# Patient Record
Sex: Female | Born: 1940 | Race: White | Hispanic: No | Marital: Married | Smoking: Former smoker
Health system: Southern US, Community
[De-identification: ages and names within clinical notes are randomized; demographics above are authoritative.]

## PROBLEM LIST (undated history)

## (undated) DIAGNOSIS — E079 Disorder of thyroid, unspecified: Secondary | ICD-10-CM

## (undated) DIAGNOSIS — I10 Essential (primary) hypertension: Secondary | ICD-10-CM

## (undated) HISTORY — PX: GALLBLADDER SURGERY: SHX652

## (undated) HISTORY — PX: CYST EXCISION: SHX5701

## (undated) HISTORY — PX: CHOLECYSTECTOMY: SHX55

## (undated) HISTORY — PX: ABDOMINAL HYSTERECTOMY: SHX81

---

## 2019-02-26 ENCOUNTER — Ambulatory Visit: Payer: Self-pay | Admitting: General Surgery

## 2019-02-26 DIAGNOSIS — R928 Other abnormal and inconclusive findings on diagnostic imaging of breast: Secondary | ICD-10-CM

## 2019-03-15 ENCOUNTER — Other Ambulatory Visit: Payer: Self-pay | Admitting: General Surgery

## 2019-03-15 DIAGNOSIS — R928 Other abnormal and inconclusive findings on diagnostic imaging of breast: Secondary | ICD-10-CM

## 2019-04-04 ENCOUNTER — Encounter (HOSPITAL_BASED_OUTPATIENT_CLINIC_OR_DEPARTMENT_OTHER): Payer: Self-pay | Admitting: General Surgery

## 2019-04-04 ENCOUNTER — Other Ambulatory Visit: Payer: Self-pay

## 2019-04-06 HISTORY — PX: BREAST EXCISIONAL BIOPSY: SUR124

## 2019-04-09 ENCOUNTER — Other Ambulatory Visit (HOSPITAL_COMMUNITY)
Admission: RE | Admit: 2019-04-09 | Discharge: 2019-04-09 | Disposition: A | Discharge status: Home / Self Care | Payer: Medicare Other | Source: Ambulatory Visit | Attending: General Surgery | Admitting: General Surgery

## 2019-04-09 DIAGNOSIS — Z01812 Encounter for preprocedural laboratory examination: Secondary | ICD-10-CM | POA: Insufficient documentation

## 2019-04-09 DIAGNOSIS — Z20822 Contact with and (suspected) exposure to covid-19: Secondary | ICD-10-CM | POA: Diagnosis not present

## 2019-04-09 LAB — SARS CORONAVIRUS 2 (TAT 6-24 HRS): SARS Coronavirus 2: NEGATIVE

## 2019-04-10 ENCOUNTER — Other Ambulatory Visit: Payer: Self-pay

## 2019-04-10 ENCOUNTER — Encounter (HOSPITAL_BASED_OUTPATIENT_CLINIC_OR_DEPARTMENT_OTHER)
Admission: RE | Admit: 2019-04-10 | Discharge: 2019-04-10 | Disposition: A | Discharge status: Home / Self Care | Payer: Medicare Other | Source: Ambulatory Visit | Attending: General Surgery | Admitting: General Surgery

## 2019-04-10 DIAGNOSIS — Z79890 Hormone replacement therapy (postmenopausal): Secondary | ICD-10-CM | POA: Diagnosis not present

## 2019-04-10 DIAGNOSIS — R921 Mammographic calcification found on diagnostic imaging of breast: Secondary | ICD-10-CM | POA: Diagnosis present

## 2019-04-10 DIAGNOSIS — Z888 Allergy status to other drugs, medicaments and biological substances status: Secondary | ICD-10-CM | POA: Diagnosis not present

## 2019-04-10 DIAGNOSIS — E039 Hypothyroidism, unspecified: Secondary | ICD-10-CM | POA: Diagnosis not present

## 2019-04-10 DIAGNOSIS — N6012 Diffuse cystic mastopathy of left breast: Secondary | ICD-10-CM | POA: Diagnosis not present

## 2019-04-10 DIAGNOSIS — I10 Essential (primary) hypertension: Secondary | ICD-10-CM | POA: Diagnosis not present

## 2019-04-10 DIAGNOSIS — Z886 Allergy status to analgesic agent status: Secondary | ICD-10-CM | POA: Diagnosis not present

## 2019-04-10 DIAGNOSIS — N6082 Other benign mammary dysplasias of left breast: Secondary | ICD-10-CM | POA: Diagnosis not present

## 2019-04-10 DIAGNOSIS — Z79899 Other long term (current) drug therapy: Secondary | ICD-10-CM | POA: Diagnosis not present

## 2019-04-10 DIAGNOSIS — Z885 Allergy status to narcotic agent status: Secondary | ICD-10-CM | POA: Diagnosis not present

## 2019-04-10 LAB — BASIC METABOLIC PANEL
Anion gap: 11 (ref 5–15)
BUN: 12 mg/dL (ref 8–23)
CO2: 21 mmol/L — ABNORMAL LOW (ref 22–32)
Calcium: 9.2 mg/dL (ref 8.9–10.3)
Chloride: 107 mmol/L (ref 98–111)
Creatinine, Ser: 0.68 mg/dL (ref 0.44–1.00)
GFR calc Af Amer: >60 mL/min (ref >60)
GFR calc non Af Amer: >60 mL/min (ref >60)
Glucose, Bld: 80 mg/dL (ref 70–99)
Potassium: 4.9 mmol/L (ref 3.5–5.1)
Sodium: 139 mmol/L (ref 135–145)

## 2019-04-10 NOTE — Progress Notes (Signed)

## 2019-04-11 ENCOUNTER — Ambulatory Visit
Admission: RE | Admit: 2019-04-11 | Discharge: 2019-04-11 | Disposition: A | Discharge status: Home / Self Care | Payer: Medicare Other | Source: Ambulatory Visit | Attending: General Surgery | Admitting: General Surgery

## 2019-04-11 ENCOUNTER — Other Ambulatory Visit: Payer: Self-pay

## 2019-04-12 ENCOUNTER — Ambulatory Visit
Admission: RE | Admit: 2019-04-12 | Discharge: 2019-04-12 | Disposition: A | Discharge status: Home / Self Care | Payer: Medicare Other | Source: Ambulatory Visit | Attending: General Surgery | Admitting: General Surgery

## 2019-04-12 ENCOUNTER — Encounter (HOSPITAL_BASED_OUTPATIENT_CLINIC_OR_DEPARTMENT_OTHER): Payer: Self-pay | Admitting: General Surgery

## 2019-04-12 ENCOUNTER — Ambulatory Visit (HOSPITAL_BASED_OUTPATIENT_CLINIC_OR_DEPARTMENT_OTHER): Payer: Medicare Other | Admitting: Anesthesiology

## 2019-04-12 ENCOUNTER — Ambulatory Visit (HOSPITAL_BASED_OUTPATIENT_CLINIC_OR_DEPARTMENT_OTHER)
Admission: RE | Admit: 2019-04-12 | Discharge: 2019-04-12 | Disposition: A | Discharge status: Home / Self Care | Payer: Medicare Other | Attending: General Surgery | Admitting: General Surgery

## 2019-04-12 ENCOUNTER — Other Ambulatory Visit: Payer: Self-pay

## 2019-04-12 ENCOUNTER — Encounter (HOSPITAL_BASED_OUTPATIENT_CLINIC_OR_DEPARTMENT_OTHER)
Admission: RE | Disposition: A | Discharge status: Home / Self Care | Payer: Self-pay | Source: Home / Self Care | Attending: General Surgery

## 2019-04-12 DIAGNOSIS — N6012 Diffuse cystic mastopathy of left breast: Secondary | ICD-10-CM | POA: Diagnosis not present

## 2019-04-12 DIAGNOSIS — Z79899 Other long term (current) drug therapy: Secondary | ICD-10-CM | POA: Insufficient documentation

## 2019-04-12 DIAGNOSIS — Z886 Allergy status to analgesic agent status: Secondary | ICD-10-CM | POA: Insufficient documentation

## 2019-04-12 DIAGNOSIS — I10 Essential (primary) hypertension: Secondary | ICD-10-CM | POA: Insufficient documentation

## 2019-04-12 DIAGNOSIS — Z885 Allergy status to narcotic agent status: Secondary | ICD-10-CM | POA: Insufficient documentation

## 2019-04-12 DIAGNOSIS — N6082 Other benign mammary dysplasias of left breast: Secondary | ICD-10-CM | POA: Diagnosis not present

## 2019-04-12 DIAGNOSIS — Z888 Allergy status to other drugs, medicaments and biological substances status: Secondary | ICD-10-CM | POA: Insufficient documentation

## 2019-04-12 DIAGNOSIS — Z79890 Hormone replacement therapy (postmenopausal): Secondary | ICD-10-CM | POA: Insufficient documentation

## 2019-04-12 DIAGNOSIS — R928 Other abnormal and inconclusive findings on diagnostic imaging of breast: Secondary | ICD-10-CM

## 2019-04-12 DIAGNOSIS — E039 Hypothyroidism, unspecified: Secondary | ICD-10-CM | POA: Diagnosis not present

## 2019-04-12 HISTORY — PX: RADIOACTIVE SEED GUIDED EXCISIONAL BREAST BIOPSY: SHX6490

## 2019-04-12 HISTORY — DX: Disorder of thyroid, unspecified: E07.9

## 2019-04-12 HISTORY — DX: Essential (primary) hypertension: I10

## 2019-04-12 SURGERY — RADIOACTIVE SEED GUIDED BREAST BIOPSY
Anesthesia: General | Site: Breast | Laterality: Left

## 2019-04-12 MED ORDER — CHLORHEXIDINE GLUCONATE CLOTH 2 % EX PADS: 6.0000 | MEDICATED_PAD | Freq: Once | CUTANEOUS | Status: DC

## 2019-04-12 MED ORDER — PROPOFOL 10 MG/ML IV BOLUS
INTRAVENOUS | Status: AC
  Filled 2019-04-12: qty 20

## 2019-04-12 MED ORDER — FENTANYL CITRATE (PF) 100 MCG/2ML IJ SOLN
50.0000 ug | INTRAMUSCULAR | Status: AC | PRN
  Administered 2019-04-12: 25 ug via INTRAVENOUS
  Administered 2019-04-12: 50 ug via INTRAVENOUS
  Administered 2019-04-12: 25 ug via INTRAVENOUS

## 2019-04-12 MED ORDER — MIDAZOLAM HCL 2 MG/2ML IJ SOLN: 1.0000 mg | INTRAMUSCULAR | Status: DC | PRN

## 2019-04-12 MED ORDER — SCOPOLAMINE 1 MG/3DAYS TD PT72: 1.0000 | MEDICATED_PATCH | TRANSDERMAL | Status: DC

## 2019-04-12 MED ORDER — PROMETHAZINE HCL 25 MG/ML IJ SOLN: 6.2500 mg | INTRAMUSCULAR | Status: DC | PRN

## 2019-04-12 MED ORDER — ONDANSETRON HCL 4 MG/2ML IJ SOLN
INTRAMUSCULAR | Status: DC | PRN
  Administered 2019-04-12: 4 mg via INTRAVENOUS

## 2019-04-12 MED ORDER — ACETAMINOPHEN 500 MG PO TABS
1000.0000 mg | ORAL_TABLET | ORAL | Status: AC
  Administered 2019-04-12: 1000 mg via ORAL

## 2019-04-12 MED ORDER — ONDANSETRON HCL 4 MG/2ML IJ SOLN
INTRAMUSCULAR | Status: AC
  Filled 2019-04-12: qty 2

## 2019-04-12 MED ORDER — ACETAMINOPHEN 500 MG PO TABS
ORAL_TABLET | ORAL | Status: AC
  Filled 2019-04-12: qty 2

## 2019-04-12 MED ORDER — FENTANYL CITRATE (PF) 100 MCG/2ML IJ SOLN
INTRAMUSCULAR | Status: AC
  Filled 2019-04-12: qty 2

## 2019-04-12 MED ORDER — LIDOCAINE HCL (CARDIAC) PF 100 MG/5ML IV SOSY
PREFILLED_SYRINGE | INTRAVENOUS | Status: DC | PRN
  Administered 2019-04-12: 50 mg via INTRAVENOUS

## 2019-04-12 MED ORDER — LACTATED RINGERS IV SOLN: INTRAVENOUS | Status: DC

## 2019-04-12 MED ORDER — BUPIVACAINE HCL (PF) 0.25 % IJ SOLN
INTRAMUSCULAR | Status: DC | PRN
  Administered 2019-04-12: 15 mL

## 2019-04-12 MED ORDER — LIDOCAINE-EPINEPHRINE 0.5 %-1:200000 IJ SOLN
INTRAMUSCULAR | Status: DC | PRN
  Administered 2019-04-12: 15 mL

## 2019-04-12 MED ORDER — LIDOCAINE 2% (20 MG/ML) 5 ML SYRINGE
INTRAMUSCULAR | Status: AC
  Filled 2019-04-12: qty 5

## 2019-04-12 MED ORDER — EPHEDRINE 5 MG/ML INJ
INTRAVENOUS | Status: AC
  Filled 2019-04-12: qty 10

## 2019-04-12 MED ORDER — TRAMADOL HCL 50 MG PO TABS: 50.0000 mg | ORAL_TABLET | Freq: Two times a day (BID) | ORAL | 0 refills | Status: AC | PRN

## 2019-04-12 MED ORDER — KETOROLAC TROMETHAMINE 15 MG/ML IJ SOLN: 15.0000 mg | Freq: Once | INTRAMUSCULAR | Status: DC | PRN

## 2019-04-12 MED ORDER — PROPOFOL 10 MG/ML IV BOLUS
INTRAVENOUS | Status: DC | PRN
  Administered 2019-04-12: 150 mg via INTRAVENOUS

## 2019-04-12 MED ORDER — EPHEDRINE SULFATE 50 MG/ML IJ SOLN
INTRAMUSCULAR | Status: DC | PRN
  Administered 2019-04-12 (×2): 10 mg via INTRAVENOUS

## 2019-04-12 MED ORDER — FENTANYL CITRATE (PF) 100 MCG/2ML IJ SOLN: 25.0000 ug | INTRAMUSCULAR | Status: DC | PRN

## 2019-04-12 MED ORDER — CEFAZOLIN SODIUM-DEXTROSE 2-4 GM/100ML-% IV SOLN
2.0000 g | INTRAVENOUS | Status: AC
  Administered 2019-04-12: 2 g via INTRAVENOUS

## 2019-04-12 MED ORDER — CEFAZOLIN SODIUM-DEXTROSE 2-4 GM/100ML-% IV SOLN
INTRAVENOUS | Status: AC
  Filled 2019-04-12: qty 100

## 2019-04-12 SURGICAL SUPPLY — 44 items
BINDER BREAST XLRG (GAUZE/BANDAGES/DRESSINGS) ×2 IMPLANT
BLADE SURG 10 STRL SS (BLADE) ×2 IMPLANT
BLADE SURG 15 STRL LF DISP TIS (BLADE) ×1 IMPLANT
BLADE SURG 15 STRL SS (BLADE) ×1
CANISTER SUC SOCK COL 7IN (MISCELLANEOUS) IMPLANT
CANISTER SUCT 1200ML W/VALVE (MISCELLANEOUS) ×2 IMPLANT
CHLORAPREP W/TINT 26 (MISCELLANEOUS) ×2 IMPLANT
CLIP VESOCCLUDE LG 6/CT (CLIP) ×2 IMPLANT
COVER BACK TABLE 60X90IN (DRAPES) ×2 IMPLANT
COVER MAYO STAND STRL (DRAPES) ×2 IMPLANT
COVER PROBE W GEL 5X96 (DRAPES) ×2 IMPLANT
COVER WAND RF STERILE (DRAPES) IMPLANT
DERMABOND ADVANCED (GAUZE/BANDAGES/DRESSINGS) ×1
DERMABOND ADVANCED .7 DNX12 (GAUZE/BANDAGES/DRESSINGS) ×1 IMPLANT
DRAPE LAPAROSCOPIC ABDOMINAL (DRAPES) ×2 IMPLANT
DRAPE UTILITY XL STRL (DRAPES) ×2 IMPLANT
ELECT COATED BLADE 2.86 ST (ELECTRODE) ×2 IMPLANT
ELECT REM PT RETURN 9FT ADLT (ELECTROSURGICAL) ×2
ELECTRODE REM PT RTRN 9FT ADLT (ELECTROSURGICAL) ×1 IMPLANT
GAUZE SPONGE 4X4 12PLY STRL LF (GAUZE/BANDAGES/DRESSINGS) ×2 IMPLANT
GLOVE BIO SURGEON STRL SZ 6 (GLOVE) ×2 IMPLANT
GLOVE BIO SURGEON STRL SZ 6.5 (GLOVE) ×2 IMPLANT
GLOVE BIOGEL PI IND STRL 6.5 (GLOVE) ×1 IMPLANT
GLOVE BIOGEL PI INDICATOR 6.5 (GLOVE) ×1
GOWN STRL REUS W/ TWL LRG LVL3 (GOWN DISPOSABLE) ×1 IMPLANT
GOWN STRL REUS W/TWL 2XL LVL3 (GOWN DISPOSABLE) ×2 IMPLANT
GOWN STRL REUS W/TWL LRG LVL3 (GOWN DISPOSABLE) ×1
KIT MARKER MARGIN INK (KITS) ×2 IMPLANT
LIGHT WAVEGUIDE WIDE FLAT (MISCELLANEOUS) ×2 IMPLANT
NEEDLE HYPO 25X1 1.5 SAFETY (NEEDLE) ×2 IMPLANT
NS IRRIG 1000ML POUR BTL (IV SOLUTION) ×2 IMPLANT
PACK BASIN DAY SURGERY FS (CUSTOM PROCEDURE TRAY) ×2 IMPLANT
PENCIL SMOKE EVACUATOR (MISCELLANEOUS) ×2 IMPLANT
SLEEVE SCD COMPRESS KNEE MED (MISCELLANEOUS) ×2 IMPLANT
SPONGE LAP 18X18 RF (DISPOSABLE) ×2 IMPLANT
STRIP CLOSURE SKIN 1/2X4 (GAUZE/BANDAGES/DRESSINGS) ×2 IMPLANT
SUT MON AB 4-0 PC3 18 (SUTURE) ×2 IMPLANT
SUT VICRYL 3-0 CR8 SH (SUTURE) ×2 IMPLANT
SYR BULB 3OZ (MISCELLANEOUS) ×2 IMPLANT
SYR CONTROL 10ML LL (SYRINGE) ×2 IMPLANT
TOWEL GREEN STERILE FF (TOWEL DISPOSABLE) ×2 IMPLANT
TRAY FAXITRON CT DISP (TRAY / TRAY PROCEDURE) ×2 IMPLANT
TUBE CONNECTING 20X1/4 (TUBING) ×2 IMPLANT
YANKAUER SUCT BULB TIP NO VENT (SUCTIONS) ×2 IMPLANT

## 2019-04-12 NOTE — Anesthesia Postprocedure Evaluation (Signed)
Anesthesia Post Note  Patient: Cassandra Leonard  Procedure(s) Performed: RADIOACTIVE SEED GUIDED EXCISIONAL LEFT BREAST BIOPSY (Left Breast)     Patient location during evaluation: PACU Anesthesia Type: General Level of consciousness: awake and alert, oriented and patient cooperative Pain management: pain level controlled Vital Signs Assessment: post-procedure vital signs reviewed and stable Respiratory status: spontaneous breathing, nonlabored ventilation and respiratory function stable Cardiovascular status: blood pressure returned to baseline and stable Postop Assessment: no apparent nausea or vomiting Anesthetic complications: no    Last Vitals:  Vitals:   04/12/19 1045 04/12/19 1102  BP: (!) 105/54 124/65  Pulse: 96 92  Resp: 15 18  Temp:  36.5 C  SpO2: 97% 96%    Last Pain:  Vitals:   04/12/19 1102  TempSrc: Oral  PainSc: 3                  Lannie Fields

## 2019-04-12 NOTE — Interval H&P Note (Signed)
History and Physical Interval Note:  04/12/2019 9:06 AM  Cassandra Leonard  has presented today for surgery, with the diagnosis of left breast abnormal mammogram.  The various methods of treatment have been discussed with the patient and family. After consideration of risks, benefits and other options for treatment, the patient has consented to  Procedure(s): RADIOACTIVE SEED GUIDED EXCISIONAL LEFT BREAST BIOPSY (Left) as a surgical intervention.  The patient's history has been reviewed, patient examined, no change in status, stable for surgery.  I have reviewed the patient's chart and labs.  Questions were answered to the patient's satisfaction.     Almond Lint

## 2019-04-12 NOTE — Anesthesia Procedure Notes (Signed)
Procedure Name: LMA Insertion Date/Time: 04/12/2019 9:38 AM Performed by: Shanon Payor, CRNA Pre-anesthesia Checklist: Patient identified, Emergency Drugs available, Suction available, Patient being monitored and Timeout performed Patient Re-evaluated:Patient Re-evaluated prior to induction Oxygen Delivery Method: Circle system utilized Preoxygenation: Pre-oxygenation with 100% oxygen Induction Type: IV induction LMA: LMA inserted LMA Size: 3.0 Number of attempts: 1 Placement Confirmation: positive ETCO2 and breath sounds checked- equal and bilateral Tube secured with: Tape Dental Injury: Teeth and Oropharynx as per pre-operative assessment

## 2019-04-12 NOTE — Op Note (Signed)
Left Breast Radioactive seed localized excisional biopsy  Indications: This patient presents with history of abnormal left mammogram with discordant core needle biopsy.    Pre-operative Diagnosis: abnormal left mammogram    Post-operative Diagnosis: abnormal left mammogram  Surgeon: Stark Klein   Anesthesia: General endotracheal anesthesia  ASA Class: 2  Procedure Details  The patient was seen in the Holding Room. The risks, benefits, complications, treatment options, and expected outcomes were discussed with the patient. The possibilities of bleeding, infection, the need for additional procedures, failure to diagnose a condition, and creating a complication requiring transfusion or operation were discussed with the patient. The patient concurred with the proposed plan, giving informed consent.  The site of surgery properly noted/marked. The patient was taken to Operating Room # 2, identified, and the procedure verified as left Breast seed localized excisional biopsy. A Time Out was held and the above information confirmed.  The left breast and chest were prepped and draped in standard fashion. A superolateral circumareolar incision was made near the previously placed radioactive seed.  Dissection was carried down around the point of maximum signal intensity. The cautery was used to perform the dissection.   The specimen was inked with the margin marker paint kit.    Specimen radiography confirmed inclusion of the mammographic lesion, the clip, and the seed.  The background signal in the breast was zero.    Hemostasis was achieved with cautery.  The wound was irrigated and closed with 3-0 vicryl interrupted deep dermal sutures and 4-0 monocryl running subcuticular suture.      Sterile dressings were applied. At the end of the operation, all sponge, instrument, and needle counts were correct.  Findings: Seed, clip in specimen.  Anterior margin is skin  Estimated Blood Loss:  min          Specimens: left breast tissue with seed         Complications:  None; patient tolerated the procedure well.         Disposition: PACU - hemodynamically stable.         Condition: stable

## 2019-04-12 NOTE — H&P (Signed)
Cassandra Leonard Location: [facility] Patient #: [id number] DOB: 09-Feb-1941 Married / Language: Undefined / Race: Refused to Report/Unreported Female   History of Present Illness  The patient is a 79 year old female who presents with a complaint of Breast problems. Pt is a lovely 79 yo F referred by Billie Ruddy, ANP for diagnosis of abnormal left screening mammogram and now dx of ADH. She has been up to date for years with her mammograms and knows that she had a cyst once after she got callback and ultrasound. This time, she had screening done 12/07/2018, but didn't get a callback until 10/30. She underwent dx mammogram which showed 1.5 cm of calcifications in the upper outer quadrant of the left breast. 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 Breast density is B. Her films are from [facility].   We have stereotactic biopsy and pathology report. She was able to log into MyChart from [facility] and show me dx mammogram.   She is from [place] and now lives in [place] [facility].     Past Surgical History  Breast Biopsy  Left. Cataract Surgery  Bilateral. Colon Polyp Removal - Colonoscopy  Gallbladder Surgery - Open  Hysterectomy (not due to cancer) - Complete   Diagnostic Studies History  Colonoscopy  5-10 years ago Mammogram  within last year Pap Smear  >5 years ago  Allergies  Codeine Sulfate *ANALGESICS - OPIOID*  Lisinopril *ANTIHYPERTENSIVES*  Allergies Reconciled   Medication History  Synthroid (75MCG Tablet, Oral) Active. hydroCHLOROthiazide (25MG  Tablet, Oral) Active. Medications Reconciled  Pregnancy / Birth History Age at menarche  47 years. Age of menopause  20-50 Maternal age  75-25 Para  0  Other Problems  High blood pressure  Hypercholesterolemia  Oophorectomy  Bilateral. Thyroid Disease     Review of Systems  General Not Present- Appetite Loss, Chills, Fatigue, Fever,  Night Sweats, Weight Gain and Weight Loss. Skin Not Present- Change in Wart/Mole, Dryness, Hives, Jaundice, New Lesions, Non-Healing Wounds, Rash and Ulcer. HEENT Not Present- Earache, Hearing Loss, Hoarseness, Nose Bleed, Oral Ulcers, Ringing in the Ears, Seasonal Allergies, Sinus Pain, Sore Throat, Visual Disturbances, Wears glasses/contact lenses and Yellow Eyes. Respiratory Not Present- Bloody sputum, Chronic Cough, Difficulty Breathing, Snoring and Wheezing. Breast Not Present- Breast Mass, Breast Pain, Nipple Discharge and Skin Changes. Cardiovascular Present- Leg Cramps. Not Present- Chest Pain, Difficulty Breathing Lying Down, Palpitations, Rapid Heart Rate, Shortness of Breath and Swelling of Extremities. Gastrointestinal Not Present- Abdominal Pain, Bloating, Bloody Stool, Change in Bowel Habits, Chronic diarrhea, Constipation, Difficulty Swallowing, Excessive gas, Gets full quickly at meals, Hemorrhoids, Indigestion, Nausea, Rectal Pain and Vomiting. Female Genitourinary Not Present- Frequency, Nocturia, Painful Urination, Pelvic Pain and Urgency. Musculoskeletal Not Present- Back Pain, Joint Pain, Joint Stiffness, Muscle Pain, Muscle Weakness and Swelling of Extremities. Neurological Present- Tingling. Not Present- Decreased Memory, Fainting, Headaches, Numbness, Seizures, Tremor, Trouble walking and Weakness. Psychiatric Not Present- Anxiety, Bipolar, Change in Sleep Pattern, Depression, Fearful and Frequent crying. Endocrine Not Present- Cold Intolerance, Excessive Hunger, Hair Changes, Heat Intolerance, Hot flashes and New Diabetes. Hematology Not Present- Blood Thinners, Easy Bruising, Excessive bleeding, Gland problems, HIV and Persistent Infections.  Vitals  Weight: 195 lb Height: 65in Body Surface Area: 1.96 m Body Mass Index: 32.45 kg/m  Temp.: 98.7F  BP: 130/85 (Sitting, Left Arm, Standard)       Physical Exam  General Mental Status-Alert. General  Appearance-Consistent with stated age. Hydration-Well hydrated. Voice-Normal.  Head and Neck Head-normocephalic,  atraumatic with no lesions or palpable masses. Trachea-midline. Thyroid Gland Characteristics - normal size and consistency.  Eye Eyeball - Bilateral-Extraocular movements intact. Sclera/Conjunctiva - Bilateral-No scleral icterus.  Chest and Lung Exam Chest and lung exam reveals -quiet, even and easy respiratory effort with no use of accessory muscles and on auscultation, normal breath sounds, no adventitious sounds and normal vocal resonance. Inspection Chest Wall - Normal. Back - normal.  Breast Note: breasts are symmetric bilaterally. no palpable masses. faint bruising and small hematoma upper outer left breast. no LAD. no nipple retraction or skin dimpling.   Cardiovascular Cardiovascular examination reveals -normal heart sounds, regular rate and rhythm with no murmurs and normal pedal pulses bilaterally.  Abdomen Inspection Inspection of the abdomen reveals - No Hernias. Palpation/Percussion Palpation and Percussion of the abdomen reveal - Soft, Non Tender, No Rebound tenderness, No Rigidity (guarding) and No hepatosplenomegaly. Auscultation Auscultation of the abdomen reveals - Bowel sounds normal.  Neurologic Neurologic evaluation reveals -alert and oriented x 3 with no impairment of recent or remote memory. Mental Status-Normal.  Musculoskeletal Global Assessment -Note: no gross deformities.  Normal Exam - Left-Upper Extremity Strength Normal and Lower Extremity Strength Normal. Normal Exam - Right-Upper Extremity Strength Normal and Lower Extremity Strength Normal.  Lymphatic Head & Neck  General Head & Neck Lymphatics: Bilateral - Description - Normal. Axillary  General Axillary Region: Bilateral - Description - Normal. Tenderness - Non Tender. Femoral & Inguinal  Generalized Femoral & Inguinal Lymphatics:  Bilateral - Description - No Generalized lymphadenopathy.    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 Will need excisional biopsy.  The surgical procedure was described to the patient. I discussed the incision type and location and that we would need radiology involved on with a wire or seed marker and/or sentinel node.  The risks and benefits of the procedure were described to the patient and she wishes to proceed.  We discussed the risks bleeding, infection, damage to other structures, need for further procedures/surgeries. We discussed the risk of seroma. The patient was advised if the area in the breast in cancer, we may need to go back to surgery for additional tissue to obtain negative margins or for a lymph node biopsy. The patient was advised that these are the most common complications, but that others can occur as well. They were advised against taking aspirin or other anti-inflammatory agents/blood thinners the week before surgery. Current Plans You are being scheduled for surgery- Our schedulers will call you.  You should hear from our office's scheduling department within 5 working days about the location, date, and time of surgery. We try to make accommodations for patient's preferences in scheduling surgery, but sometimes the OR schedule or the surgeon's schedule prevents [place] from making those accommodations.  If you have not heard from our office [phone]) in 5 working days, call the office and ask for your surgeon's nurse.  If you have other questions about your diagnosis, plan, or surgery, call the office and ask for your surgeon's nurse.  Pt Education - [facility] Breast Biopsy HCI: discussed with patient and provided information.

## 2019-04-12 NOTE — Anesthesia Preprocedure Evaluation (Signed)
Anesthesia Evaluation  Patient identified by MRN, date of birth, ID band Patient awake    Reviewed: Allergy & Precautions, NPO status , Patient's Chart, lab work & pertinent test results  Airway Mallampati: II  TM Distance: >3 FB Neck ROM: Full    Dental no notable dental hx. (+) Teeth Intact, Dental Advisory Given   Pulmonary former smoker,    Pulmonary exam normal breath sounds clear to auscultation       Cardiovascular hypertension, Pt. on medications Normal cardiovascular exam Rhythm:Regular Rate:Normal     Neuro/Psych negative neurological ROS  negative psych ROS   GI/Hepatic negative GI ROS, Neg liver ROS,   Endo/Other  Hypothyroidism   Renal/GU negative Renal ROS  negative genitourinary   Musculoskeletal negative musculoskeletal ROS (+)   Abdominal Normal abdominal exam  (+)   Peds negative pediatric ROS (+)  Hematology negative hematology ROS (+)   Anesthesia Other Findings   Reproductive/Obstetrics negative OB ROS                             Anesthesia Physical Anesthesia Plan  ASA: II  Anesthesia Plan: General   Post-op Pain Management:    Induction: Intravenous  PONV Risk Score and Plan: 3 and Ondansetron, Dexamethasone and Treatment may vary due to age or medical condition  Airway Management Planned: LMA  Additional Equipment: None  Intra-op Plan:   Post-operative Plan: Extubation in OR  Informed Consent: I have reviewed the patients History and Physical, chart, labs and discussed the procedure including the risks, benefits and alternatives for the proposed anesthesia with the patient or authorized representative who has indicated his/her understanding and acceptance.     Dental advisory given  Plan Discussed with: CRNA  Anesthesia Plan Comments:         Anesthesia Quick Evaluation

## 2019-04-12 NOTE — Transfer of Care (Signed)
Immediate Anesthesia Transfer of Care Note  Patient: Cassandra Leonard  Procedure(s) Performed: RADIOACTIVE SEED GUIDED EXCISIONAL LEFT BREAST BIOPSY (Left Breast)  Patient Location: PACU  Anesthesia Type:General  Level of Consciousness: awake, alert , oriented and patient cooperative  Airway & Oxygen Therapy: Patient Spontanous Breathing and Patient connected to face mask oxygen  Post-op Assessment: Report given to RN and Post -op Vital signs reviewed and stable  Post vital signs: Reviewed and stable  Last Vitals:  Vitals Value Taken Time  BP    Temp    Pulse    Resp    SpO2      Last Pain:  Vitals:   04/12/19 0750  TempSrc: Oral  PainSc: 0-No pain      Patients Stated Pain Goal: 3 (04/12/19 0750)  Complications: No apparent anesthesia complications

## 2019-04-12 NOTE — Discharge Instructions (Addendum)
[facility] Office Phone Number [phone]  BREAST BIOPSY/ PARTIAL MASTECTOMY: POST OP INSTRUCTIONS  Always review your discharge instruction sheet given to you by the facility where your surgery was performed.  IF YOU HAVE DISABILITY OR FAMILY LEAVE FORMS, YOU MUST BRING THEM TO THE OFFICE FOR PROCESSING.  DO NOT GIVE THEM TO YOUR DOCTOR.  1. A prescription for pain medication may be given to you upon discharge.  Take your pain medication as prescribed, if needed.  If narcotic pain medicine is not needed, then you may take acetaminophen (Tylenol) or ibuprofen (Advil) as needed. No Tylenol until 2pm 2. Take your usually prescribed medications unless otherwise directed 3. If you need a refill on your pain medication, please contact your pharmacy.  They will contact our office to request authorization.  Prescriptions will not be filled after 5pm or on week-ends. 4. You should eat very light the first 24 hours after surgery, such as soup, crackers, pudding, etc.  Resume your normal diet the day after surgery. 5. Most patients will experience some swelling and bruising in the breast.  Ice packs and a good support bra will help.  Swelling and bruising can take several days to resolve.  6. It is common to experience some constipation if taking pain medication after surgery.  Increasing fluid intake and taking a stool softener will usually help or prevent this problem from occurring.  A mild laxative (Milk of Magnesia or Miralax) should be taken according to package directions if there are no bowel movements after 48 hours. 7. Unless discharge instructions indicate otherwise, you may remove your bandages 48 hours after surgery, and you may shower at that time.  You may have steri-strips (small skin tapes) in place directly over the incision.  These strips should be left on the skin for 7-10 days.   Any sutures or staples will be removed at the office during your follow-up  visit. 8. ACTIVITIES:  You may resume regular daily activities (gradually increasing) beginning the next day.  Wearing a good support bra or sports bra (or the breast binder) minimizes pain and swelling.  You may have sexual intercourse when it is comfortable. a. You may drive when you no longer are taking prescription pain medication, you can comfortably wear a seatbelt, and you can safely maneuver your car and apply brakes. b. RETURN TO WORK:  __________1 week_______________ 9. You should see your doctor in the office for a follow-up appointment approximately two weeks after your surgery.  Your doctor's nurse will typically make your follow-up appointment when she calls you with your pathology report.  Expect your pathology report 2-3 business days after your surgery.  You may call to check if you do not hear from [place] after three days.   WHEN TO CALL YOUR DOCTOR: 1. Fever over 101.0 2. Nausea and/or vomiting. 3. Extreme swelling or bruising. 4. Continued bleeding from incision. 5. Increased pain, redness, or drainage from the incision.  The clinic staff is available to answer your questions during regular business hours.  Please don't hesitate to call and ask to speak to one of the nurses for clinical concerns.  If you have a medical emergency, go to the nearest emergency room or call 911.  A surgeon from [facility] is always on call at the hospital.  For further questions, please visit [email or web address]     Post Anesthesia Home Care Instructions  Activity: Get plenty of rest for the remainder of the day. A responsible individual must  stay with you for 24 hours following the procedure.  For the next 24 hours, DO NOT: -Drive a car -Paediatric nurse -Drink alcoholic beverages -Take any medication unless instructed by your physician -Make any legal decisions or sign important papers.  Meals: Start with liquid foods such as gelatin or soup. Progress to regular  foods as tolerated. Avoid greasy, spicy, heavy foods. If nausea and/or vomiting occur, drink only clear liquids until the nausea and/or vomiting subsides. Call your physician if vomiting continues.  Special Instructions/Symptoms: Your throat may feel dry or sore from the anesthesia or the breathing tube placed in your throat during surgery. If this causes discomfort, gargle with warm salt water. The discomfort should disappear within 24 hours.  If you had a scopolamine patch placed behind your ear for the management of post- operative nausea and/or vomiting:  1. The medication in the patch is effective for 72 hours, after which it should be removed.  Wrap patch in a tissue and discard in the trash. Wash hands thoroughly with soap and water. 2. You may remove the patch earlier than 72 hours if you experience unpleasant side effects which may include dry mouth, dizziness or visual disturbances. 3. Avoid touching the patch. Wash your hands with soap and water after contact with the patch.

## 2019-04-13 ENCOUNTER — Encounter: Payer: Self-pay | Admitting: *Deleted

## 2019-04-13 LAB — SURGICAL PATHOLOGY

## 2020-01-14 ENCOUNTER — Other Ambulatory Visit: Payer: Self-pay | Admitting: General Surgery

## 2020-01-14 DIAGNOSIS — Z1231 Encounter for screening mammogram for malignant neoplasm of breast: Secondary | ICD-10-CM

## 2020-01-15 ENCOUNTER — Other Ambulatory Visit: Payer: Self-pay

## 2020-01-15 ENCOUNTER — Ambulatory Visit
Admission: RE | Admit: 2020-01-15 | Discharge: 2020-01-15 | Disposition: A | Discharge status: Home / Self Care | Payer: Medicare Other | Source: Ambulatory Visit | Attending: General Surgery | Admitting: General Surgery

## 2020-01-15 DIAGNOSIS — Z1231 Encounter for screening mammogram for malignant neoplasm of breast: Secondary | ICD-10-CM

## 2020-04-27 ENCOUNTER — Other Ambulatory Visit: Payer: Self-pay

## 2020-04-27 ENCOUNTER — Emergency Department (HOSPITAL_BASED_OUTPATIENT_CLINIC_OR_DEPARTMENT_OTHER): Payer: Medicare Other

## 2020-04-27 ENCOUNTER — Encounter (HOSPITAL_BASED_OUTPATIENT_CLINIC_OR_DEPARTMENT_OTHER): Payer: Self-pay | Admitting: Emergency Medicine

## 2020-04-27 ENCOUNTER — Emergency Department (HOSPITAL_BASED_OUTPATIENT_CLINIC_OR_DEPARTMENT_OTHER)
Admission: EM | Admit: 2020-04-27 | Discharge: 2020-04-27 | Disposition: A | Discharge status: Home / Self Care | Payer: Medicare Other | Attending: Emergency Medicine | Admitting: Emergency Medicine

## 2020-04-27 DIAGNOSIS — Z79899 Other long term (current) drug therapy: Secondary | ICD-10-CM | POA: Insufficient documentation

## 2020-04-27 DIAGNOSIS — Z87891 Personal history of nicotine dependence: Secondary | ICD-10-CM | POA: Insufficient documentation

## 2020-04-27 DIAGNOSIS — S46911A Strain of unspecified muscle, fascia and tendon at shoulder and upper arm level, right arm, initial encounter: Secondary | ICD-10-CM | POA: Diagnosis not present

## 2020-04-27 DIAGNOSIS — I10 Essential (primary) hypertension: Secondary | ICD-10-CM | POA: Insufficient documentation

## 2020-04-27 DIAGNOSIS — W19XXXA Unspecified fall, initial encounter: Secondary | ICD-10-CM

## 2020-04-27 DIAGNOSIS — S43421A Sprain of right rotator cuff capsule, initial encounter: Secondary | ICD-10-CM | POA: Diagnosis not present

## 2020-04-27 DIAGNOSIS — W06XXXA Fall from bed, initial encounter: Secondary | ICD-10-CM | POA: Insufficient documentation

## 2020-04-27 DIAGNOSIS — S4991XA Unspecified injury of right shoulder and upper arm, initial encounter: Secondary | ICD-10-CM | POA: Diagnosis present

## 2020-04-27 MED ORDER — CYCLOBENZAPRINE HCL 10 MG PO TABS: 10.0000 mg | ORAL_TABLET | Freq: Two times a day (BID) | ORAL | 0 refills | Status: AC | PRN

## 2020-04-27 NOTE — ED Notes (Signed)
Presents today with rt shoulder pain, states she fell this am going to restroom and fell on shoulder, incident occurred per pt at approx 0500hrs today. Has strong rt hand grip, color WNL, strong rt radial pulse noted, cap refill WNL, temp WNL. Pt has homemade sling on. Pt positioned for comfort

## 2020-04-27 NOTE — ED Notes (Signed)
Sling being applied by EMT ED Tech, pt teaching done by [facility], reinforced by RN, AVS also reviewed with pt and husband, discussed safety while taking PO muscle relaxants, also informed of EDP recommendations to follow up with Sports Medicine MD here at [facility]. Opportunity for questions provided

## 2020-04-27 NOTE — ED Triage Notes (Signed)
Pt arrives pov with driver. C/o fall from bed last night, reports right shoulder pain. Pt reports hearing "clicking". Pt endorses landing on butt, denies hitting head.

## 2020-04-28 NOTE — ED Provider Notes (Signed)
[facility] EMERGENCY DEPARTMENT Provider Note   CSN: [id number] Arrival date & time: 04/27/20  1031     History Chief Complaint  Patient presents with  . Fall    Cassandra Leonard is a 80 y.o. female.  HPI     80yo female with history of hypertension and thyroid disease presents with right shoulder pain after sliding out of bed last night.  Reports she was scooting herself to the end of the bed in her silk pajamas and just slid out of bed landing on her bottom with weight falling on her right arm with pain to her right shoulder.  Pain is mild if resting but severe when she tries to move it.  No numbness. Denies hitting head, LOC, neck pain, back pain or other injuries.   Past Medical History:  Diagnosis Date  . Hypertension   . Thyroid disease     There are no problems to display for this patient.   Past Surgical History:  Procedure Laterality Date  . ABDOMINAL HYSTERECTOMY    . CHOLECYSTECTOMY    . CYST EXCISION     brachial cleft cyst  . GALLBLADDER SURGERY    . RADIOACTIVE SEED GUIDED EXCISIONAL BREAST BIOPSY Left 04/12/2019   Procedure: RADIOACTIVE SEED GUIDED EXCISIONAL LEFT BREAST BIOPSY;  Surgeon: Almond Lint, MD;  Location: [facility];  Service: General;  Laterality: Left;     OB History   No obstetric history on file.     History reviewed. No pertinent family history.  Social History   Tobacco Use  . Smoking status: Former Games developer  . Smokeless tobacco: Never Used  Substance Use Topics  . Alcohol use: Yes  . Drug use: Never    Home Medications Prior to Admission medications   Medication Sig Start Date End Date Taking? Authorizing Provider  cyclobenzaprine (FLEXERIL) 10 MG tablet Take 1 tablet (10 mg total) by mouth 2 (two) times daily as needed for muscle spasms. 04/27/20  Yes Alvira Monday, MD  Ascorbic Acid (VITAMIN C PO) Take by mouth.    [provider]  Calcium Carbonate (CALCIUM 500 PO) Take by mouth.     [provider]  HYDROCHLOROTHIAZIDE PO Take by mouth.    [provider]  Levothyroxine Sodium (SYNTHROID PO) Take by mouth.    [provider]  Multiple Vitamin (MULTI-VITAMIN DAILY PO) Take by mouth.    [provider]  Omega-3 Fatty Acids (FISH OIL PO) Take by mouth.    [provider]  Pyridoxine HCl (VITAMIN B6 PO) Take by mouth.    [provider]  traMADol (ULTRAM) 50 MG tablet Take 1 tablet (50 mg total) by mouth every 12 (twelve) hours as needed for moderate pain or severe pain. 04/12/19   Almond Lint, MD  VITAMIN E PO Take by mouth.    [provider]    Allergies    Patient has no known allergies.  Review of Systems   Review of Systems  Constitutional: Negative for fever.  Respiratory: Negative for shortness of breath.   Cardiovascular: Negative for chest pain.  Musculoskeletal: Positive for arthralgias. Negative for back pain and neck pain.  Skin: Negative for wound.  Neurological: Negative for syncope and headaches.    Physical Exam Updated Vital Signs BP 125/90 (BP Location: Left Arm)   Pulse 78   Temp 98.8 F (37.1 C) (Oral)   Resp 18   Ht 5\' 5"  (1.651 m)   Wt 84.4 kg  SpO2 96%   BMI 30.95 kg/m   Physical Exam Vitals and nursing note reviewed.  Constitutional:      General: She is not in acute distress.    Appearance: Normal appearance. She is not ill-appearing, toxic-appearing or diaphoretic.  HENT:     Head: Normocephalic.  Eyes:     Conjunctiva/sclera: Conjunctivae normal.  Cardiovascular:     Rate and Rhythm: Normal rate and regular rhythm.     Pulses: Normal pulses.  Pulmonary:     Effort: Pulmonary effort is normal. No respiratory distress.  Musculoskeletal:        General: No tenderness (denies tenderness, just pain with ROM, limited abduction due to pain), deformity or signs of injury.     Cervical back: No rigidity.  Skin:    General: Skin is warm and dry.     Coloration:  Skin is not jaundiced or pale.  Neurological:     General: No focal deficit present.     Mental Status: She is alert and oriented to person, place, and time.     ED Results / Procedures / Treatments   Labs (all labs ordered are listed, but only abnormal results are displayed) Labs Reviewed - No data to display  EKG None  Radiology DG Shoulder Right  Result Date: 04/27/2020 CLINICAL DATA:  79 year old female with history of trauma from a fall from bed yesterday evening with right shoulder pain. EXAM: RIGHT SHOULDER - 2+ VIEW COMPARISON:  No priors. FINDINGS: There is no evidence of fracture or dislocation. Degenerative changes of osteoarthritis are noted in the glenohumeral and acromioclavicular joints. Soft tissues are unremarkable. IMPRESSION: 1. No acute radiographic abnormality of the right shoulder. 2. 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 Electronically Signed   By: Trudie Reed M.D.   On: 04/27/2020 12:20    Procedures Procedures   Medications Ordered in ED Medications - No data to display  ED Course  I have reviewed the triage vital signs and the nursing notes.  Pertinent labs & imaging results that were available during my care of the patient were reviewed by me and considered in my medical decision making (see chart for details).    MDM Rules/Calculators/A&P                         80yo female with history of hypertension and thyroid disease presents with right shoulder pain after sliding out of bed last night.  XR without signs of acute fracture or dislocation.  No signs of other injuries by hx of exam. Not on anticoagulation, no head trauma or LOC.  Suspect likely rotator cuff pathology. GIven sling, recommend sports medicine follow up, ibuprofen/tylenol and rx for flexeril for pain. Patient discharged in stable condition with understanding of reasons to return.    Final Clinical Impression(s) / ED Diagnoses Final diagnoses:  Fall,  initial encounter  Strain of right shoulder, initial encounter  Sprain of right rotator cuff capsule, initial encounter    Rx / DC Orders ED Discharge Orders         Ordered    cyclobenzaprine (FLEXERIL) 10 MG tablet  2 times daily PRN        04/27/20 1340           Alvira Monday, MD 04/28/20 1112

## 2020-04-29 ENCOUNTER — Ambulatory Visit: Payer: Self-pay

## 2020-04-29 ENCOUNTER — Ambulatory Visit (INDEPENDENT_AMBULATORY_CARE_PROVIDER_SITE_OTHER): Payer: Medicare Other | Admitting: Family Medicine

## 2020-04-29 ENCOUNTER — Other Ambulatory Visit: Payer: Self-pay

## 2020-04-29 VITALS — BP 120/64 | Ht 65.0 in | Wt 185.0 lb

## 2020-04-29 DIAGNOSIS — S46011A Strain of muscle(s) and tendon(s) of the rotator cuff of right shoulder, initial encounter: Secondary | ICD-10-CM

## 2020-04-29 DIAGNOSIS — M19011 Primary osteoarthritis, right shoulder: Secondary | ICD-10-CM | POA: Diagnosis present

## 2020-04-29 DIAGNOSIS — M25511 Pain in right shoulder: Secondary | ICD-10-CM

## 2020-04-29 MED ORDER — PREDNISONE 5 MG PO TABS: ORAL_TABLET | ORAL | 0 refills | Status: AC

## 2020-04-29 NOTE — Patient Instructions (Signed)
Nice to meet you Please try ice as needed  Please try the range of motion exercises  Call us with the fax number for physical therapy   Please send me a message in MyChart with any questions or updates.  Please see me back in 4-6 weeks.   --Dr. Jordan Likes

## 2020-04-29 NOTE — Assessment & Plan Note (Signed)
Does have degenerative changes of the glenohumeral joint with effusion noted. -Counseled on home exercise therapy and supportive care. -Prednisone. -Referral to physical therapy. -Could consider injection.

## 2020-04-29 NOTE — Assessment & Plan Note (Signed)
Injury occurred on 1/23.  Does have an incomplete supraspinatus tear.  The anterior leaflet appears to be fully torn and retracted. -Counseled on home exercise therapy and supportive care. -Counseled on sling use. -Could consider nitro. -Referral to physical therapy.

## 2020-04-29 NOTE — Progress Notes (Signed)
Cassandra Leonard - 80 y.o. female MRN [id number]  Date of birth: 04-Apr-1941  SUBJECTIVE:  Including CC & ROS.  No chief complaint on file.   Cassandra Leonard is a 80 y.o. female that is presenting with right shoulder pain.  She had a fall this past Sunday onto the shoulder.  Now having some limitation in range of motion and pain with abduction..  Independent review of the right shoulder x-ray from 1/23 shows calcific changes at the insertion of the supraspinatus.  Degenerative changes of the glenohumeral joint appreciated.   Review of Systems See HPI   HISTORY: Past Medical, Surgical, Social, and Family History Reviewed & Updated per EMR.   Pertinent Historical Findings include:  Past Medical History:  Diagnosis Date  . Hypertension   . Thyroid disease     Past Surgical History:  Procedure Laterality Date  . ABDOMINAL HYSTERECTOMY    . CHOLECYSTECTOMY    . CYST EXCISION     brachial cleft cyst  . GALLBLADDER SURGERY    . RADIOACTIVE SEED GUIDED EXCISIONAL BREAST BIOPSY Left 04/12/2019   Procedure: RADIOACTIVE SEED GUIDED EXCISIONAL LEFT BREAST BIOPSY;  Surgeon: Almond Lint, MD;  Location: [facility];  Service: General;  Laterality: Left;    No family history on file.  Social History   Socioeconomic History  . Marital status: Married    Spouse name: Not on file  . Number of children: Not on file  . Years of education: Not on file  . Highest education level: Not on file  Occupational History  . Not on file  Tobacco Use  . Smoking status: Former Games developer  . Smokeless tobacco: Never Used  Substance and Sexual Activity  . Alcohol use: Yes  . Drug use: Never  . Sexual activity: Not on file  Other Topics Concern  . Not on file  Social History Narrative  . Not on file   Social Determinants of Health   Financial Resource Strain: Not on file  Food Insecurity: Not on file  Transportation Needs: Not on file  Physical Activity: Not on file  Stress: Not on  file  Social Connections: Not on file  Intimate Partner Violence: Not on file     PHYSICAL EXAM:  VS: BP 120/64   Ht 5\' 5"  (1.651 m)   Wt 185 lb (83.9 kg)   BMI 30.79 kg/m  Physical Exam Gen: NAD, alert, cooperative with exam, well-appearing MSK:  Right shoulder: Normal external rotation. Overlying effusion. No ecchymosis. Limited flexion and abduction. Neurovascular intact  Limited ultrasound: Right shoulder:  Encircling effusion of the biceps tendon. Subscapularis maintains to the footprint. Overlying effusion of the subscapularis. Anterior portion of the supraspinatus leaflet appears to be complete torn with full retraction.  The middle and posterior leaflets appear to be intact.  There is no overlying subacromial bursitis. Fusion of the posterior glenohumeral joint.  Summary: Acute effusion as well as acute changes of the anterior leaflet of the supraspinatus indicating full-thickness tear.  Ultrasound and interpretation by , MD    ASSESSMENT & PLAN:   Primary osteoarthritis of right shoulder Does have degenerative changes of the glenohumeral joint with effusion noted. -Counseled on home exercise therapy and supportive care. -Prednisone. -Referral to physical therapy. -Could consider injection.  Traumatic incomplete tear of right rotator cuff Injury occurred on 1/23.  Does have an incomplete supraspinatus tear.  The anterior leaflet appears to be fully torn and retracted. -Counseled on home exercise therapy and supportive care. -Counseled on  sling use. -Could consider nitro. -Referral to physical therapy.

## 2020-04-29 NOTE — Addendum Note (Signed)
Addended by: Annita Brod on: 04/29/2020 11:51 AM   Modules accepted: Orders

## 2020-06-12 ENCOUNTER — Ambulatory Visit (INDEPENDENT_AMBULATORY_CARE_PROVIDER_SITE_OTHER): Payer: Medicare Other | Admitting: Family Medicine

## 2020-06-12 ENCOUNTER — Other Ambulatory Visit: Payer: Self-pay

## 2020-06-12 DIAGNOSIS — S46011D Strain of muscle(s) and tendon(s) of the rotator cuff of right shoulder, subsequent encounter: Secondary | ICD-10-CM

## 2020-06-12 NOTE — Progress Notes (Signed)
  Cassandra Leonard - 80 y.o. female MRN [id number]  Date of birth: 1940-07-02  SUBJECTIVE:  Including CC & ROS.  No chief complaint on file.   Cassandra Leonard is a 80 y.o. female that is following up for her right shoulder.  She had completed 2 weeks of physical therapy.  She just came back from [place].  She feels much better and has improved range of motion and function.   Review of Systems See HPI   HISTORY: Past Medical, Surgical, Social, and Family History Reviewed & Updated per EMR.   Pertinent Historical Findings include:  Past Medical History:  Diagnosis Date  . Hypertension   . Thyroid disease     Past Surgical History:  Procedure Laterality Date  . ABDOMINAL HYSTERECTOMY    . CHOLECYSTECTOMY    . CYST EXCISION     brachial cleft cyst  . GALLBLADDER SURGERY    . RADIOACTIVE SEED GUIDED EXCISIONAL BREAST BIOPSY Left 04/12/2019   Procedure: RADIOACTIVE SEED GUIDED EXCISIONAL LEFT BREAST BIOPSY;  Surgeon: Almond Lint, MD;  Location: [facility];  Service: General;  Laterality: Left;    No family history on file.  Social History   Socioeconomic History  . Marital status: Married    Spouse name: Not on file  . Number of children: Not on file  . Years of education: Not on file  . Highest education level: Not on file  Occupational History  . Not on file  Tobacco Use  . Smoking status: Former Games developer  . Smokeless tobacco: Never Used  Substance and Sexual Activity  . Alcohol use: Yes  . Drug use: Never  . Sexual activity: Not on file  Other Topics Concern  . Not on file  Social History Narrative  . Not on file   Social Determinants of Health   Financial Resource Strain: Not on file  Food Insecurity: Not on file  Transportation Needs: Not on file  Physical Activity: Not on file  Stress: Not on file  Social Connections: Not on file  Intimate Partner Violence: Not on file     PHYSICAL EXAM:  VS: BP (!) 150/88 (BP Location: Left Arm, Patient  Position: Sitting, Cuff Size: Large)   Ht 5\' 5"  (1.651 m)   Wt 185 lb (83.9 kg)   BMI 30.79 kg/m  Physical Exam Gen: NAD, alert, cooperative with exam, well-appearing MSK:  Right shoulder: Has near normal range of motion. Has good strength. Neurovascular intact     ASSESSMENT & PLAN:   Traumatic incomplete tear of right rotator cuff Has good strength and range of motion.  Has been working on her therapy in the pool at her living facility. -Counseled on home exercise therapy and supportive care. -Could consider physical therapy or injection.

## 2020-06-12 NOTE — Assessment & Plan Note (Signed)
Has good strength and range of motion.  Has been working on her therapy in the pool at her living facility. -Counseled on home exercise therapy and supportive care. -Could consider physical therapy or injection.

## 2020-12-30 ENCOUNTER — Other Ambulatory Visit: Payer: Self-pay | Admitting: General Surgery

## 2020-12-30 DIAGNOSIS — Z1231 Encounter for screening mammogram for malignant neoplasm of breast: Secondary | ICD-10-CM

## 2021-01-27 ENCOUNTER — Ambulatory Visit
Admission: RE | Admit: 2021-01-27 | Discharge: 2021-01-27 | Disposition: A | Discharge status: Home / Self Care | Payer: Medicare Other | Source: Ambulatory Visit | Attending: General Surgery | Admitting: General Surgery

## 2021-01-27 ENCOUNTER — Other Ambulatory Visit: Payer: Self-pay

## 2021-01-27 DIAGNOSIS — Z1231 Encounter for screening mammogram for malignant neoplasm of breast: Secondary | ICD-10-CM

## 2022-01-01 ENCOUNTER — Other Ambulatory Visit: Payer: Self-pay | Admitting: Nurse Practitioner

## 2022-01-01 DIAGNOSIS — Z1231 Encounter for screening mammogram for malignant neoplasm of breast: Secondary | ICD-10-CM

## 2022-01-29 ENCOUNTER — Ambulatory Visit
Admission: RE | Admit: 2022-01-29 | Discharge: 2022-01-29 | Disposition: A | Discharge status: Home / Self Care | Payer: Medicare Other | Source: Ambulatory Visit | Attending: Nurse Practitioner | Admitting: Nurse Practitioner

## 2022-01-29 DIAGNOSIS — Z1231 Encounter for screening mammogram for malignant neoplasm of breast: Secondary | ICD-10-CM

## 2022-07-19 ENCOUNTER — Encounter: Payer: Self-pay | Admitting: *Deleted

## 2022-12-22 ENCOUNTER — Other Ambulatory Visit: Payer: Self-pay | Admitting: Nurse Practitioner

## 2022-12-22 DIAGNOSIS — Z1231 Encounter for screening mammogram for malignant neoplasm of breast: Secondary | ICD-10-CM

## 2023-02-01 ENCOUNTER — Ambulatory Visit
Admission: RE | Admit: 2023-02-01 | Discharge: 2023-02-01 | Disposition: A | Discharge status: Home / Self Care | Payer: Medicare Other | Source: Ambulatory Visit | Attending: Nurse Practitioner | Admitting: Nurse Practitioner

## 2023-02-01 DIAGNOSIS — Z1231 Encounter for screening mammogram for malignant neoplasm of breast: Secondary | ICD-10-CM

## 2023-03-09 ENCOUNTER — Other Ambulatory Visit: Payer: Self-pay | Admitting: Medical Genetics

## 2023-04-08 ENCOUNTER — Other Ambulatory Visit (HOSPITAL_COMMUNITY)
Admission: RE | Admit: 2023-04-08 | Discharge: 2023-04-08 | Disposition: A | Discharge status: Home / Self Care | Payer: Self-pay | Source: Ambulatory Visit | Attending: Medical Genetics | Admitting: Medical Genetics

## 2023-04-22 LAB — GENECONNECT MOLECULAR SCREEN: Genetic Analysis Overall Interpretation: NEGATIVE

## 2023-08-11 IMAGING — MG MM DIGITAL SCREENING BILAT W/ TOMO AND CAD
6 of 10 series · 6 of 30 positions shown · non-contrast
Comparison: Previous exam(s).

CLINICAL DATA: Screening. History of LEFT breast excisional biopsy
in 9893 for ADH.

EXAM:
DIGITAL SCREENING BILATERAL MAMMOGRAM WITH TOMOSYNTHESIS AND CAD
TECHNIQUE: Bilateral screening digital craniocaudal and mediolateral oblique
mammograms were obtained. Bilateral screening digital breast
tomosynthesis was performed. The images were evaluated with
computer-aided detection.

[L CC synth-2D]
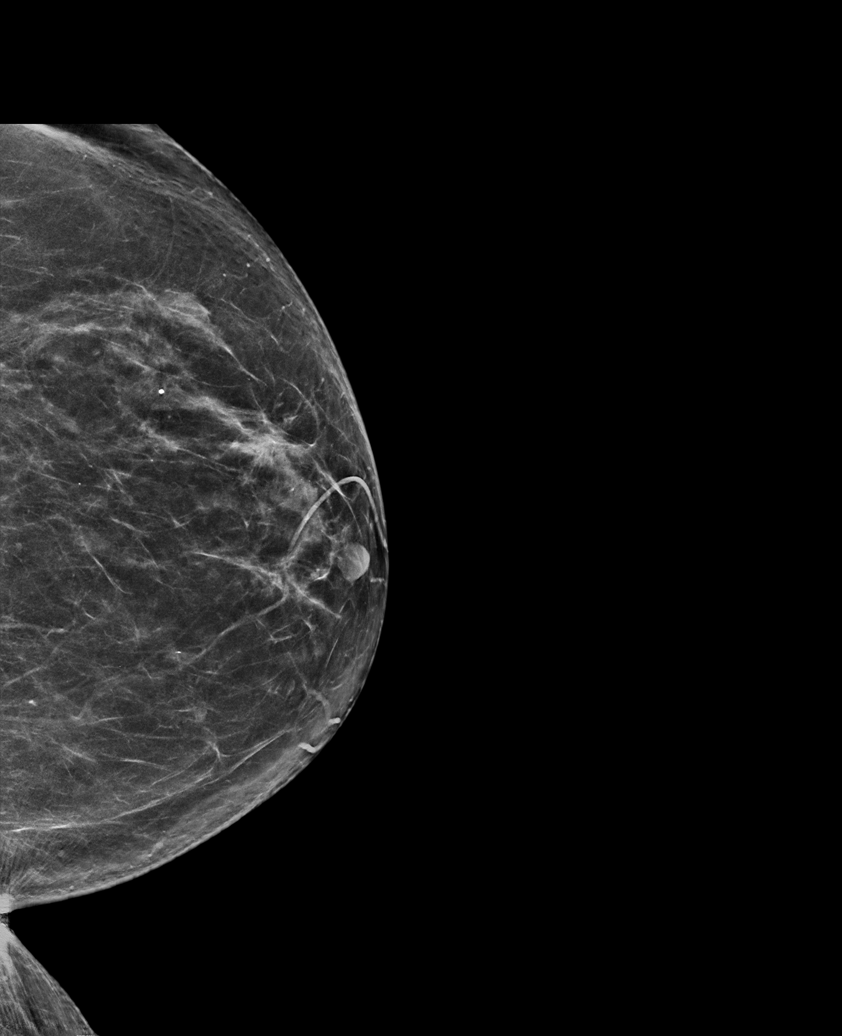

[R CC synth-2D (1 of 2)]
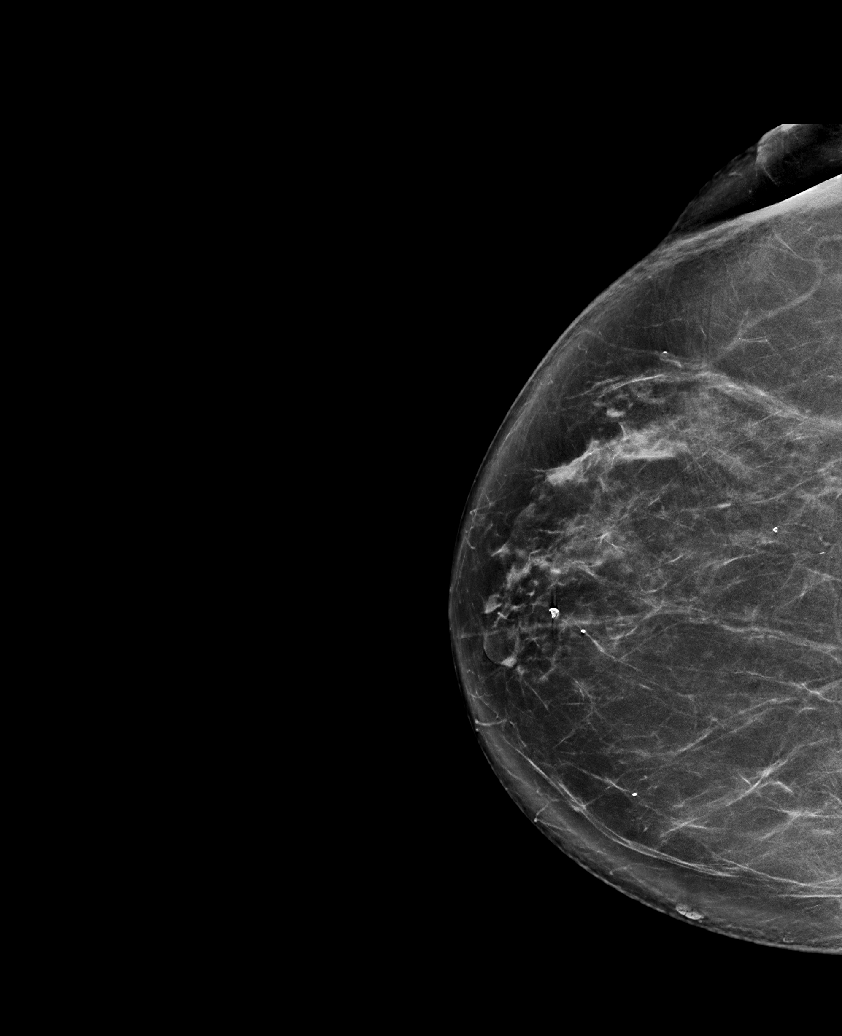

[L MLO synth-2D]
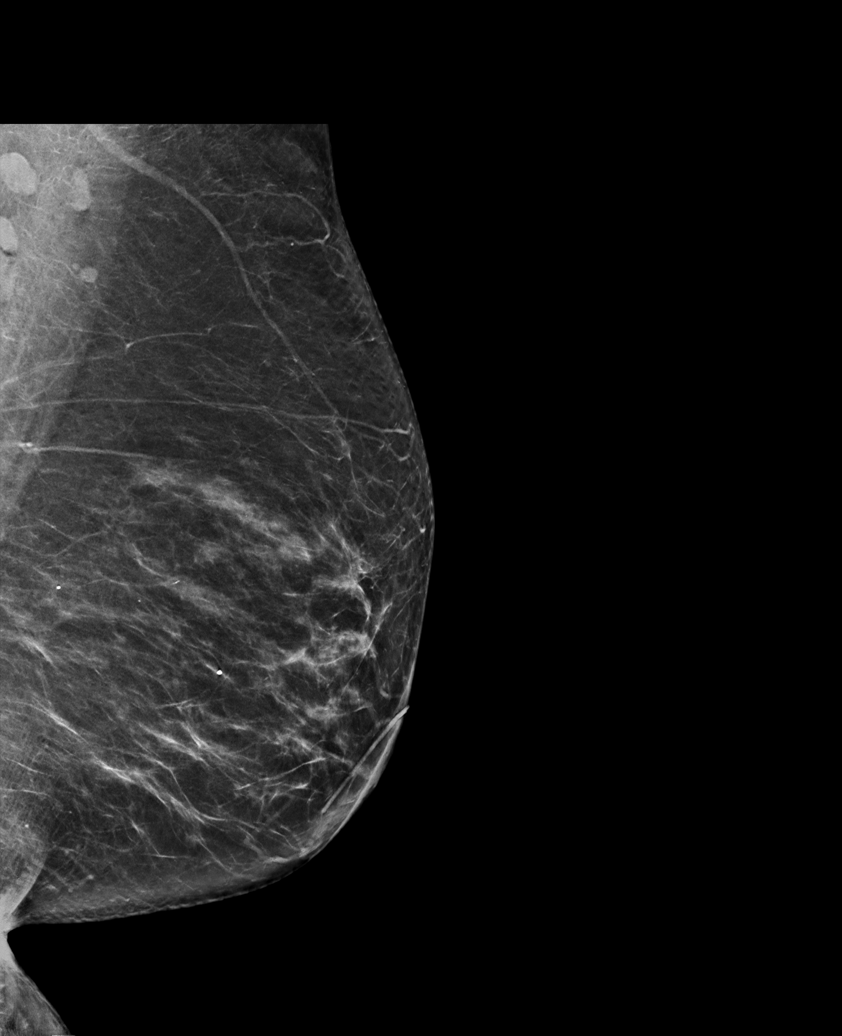

[R MLO synth-2D]
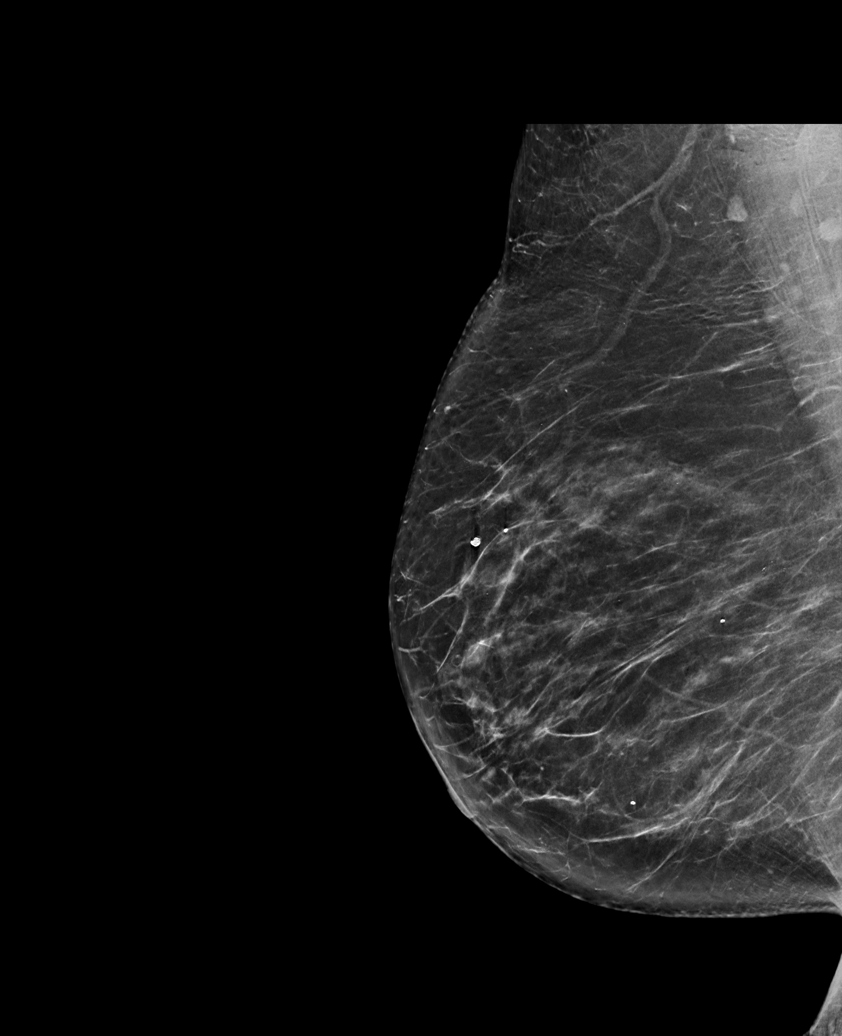

[R CC synth-2D (2 of 2)]
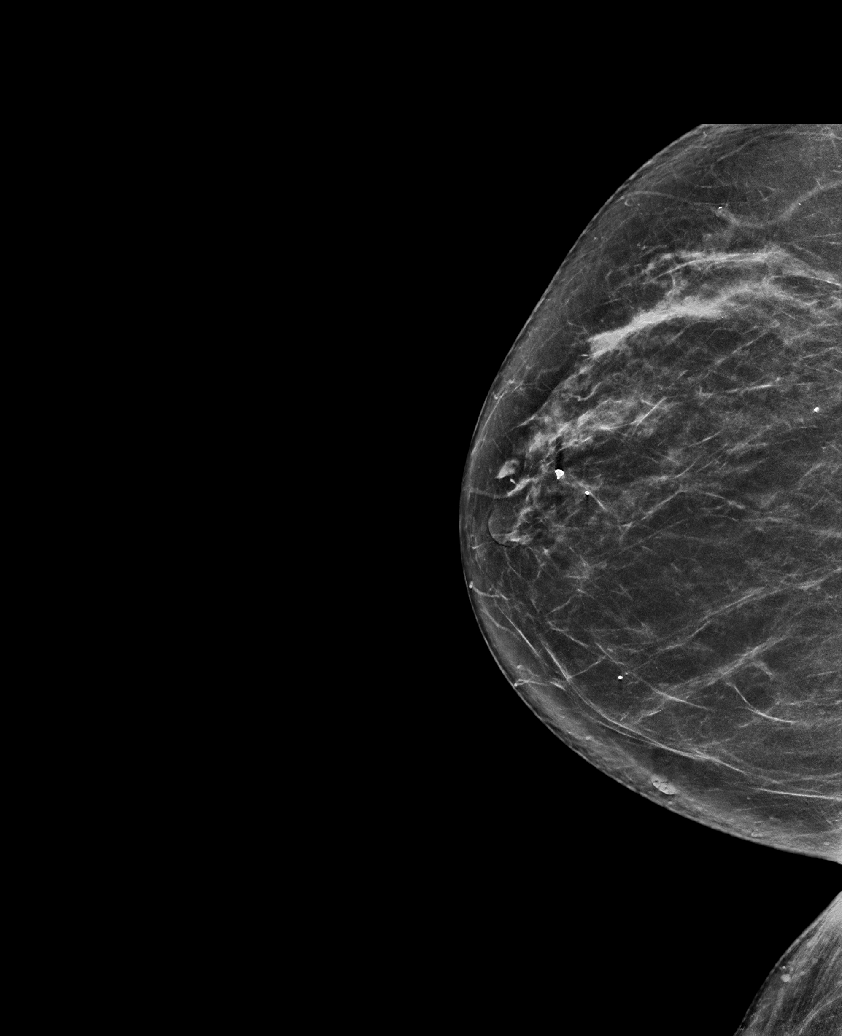

[L MLO tomo · tomo slice 39/76.0]
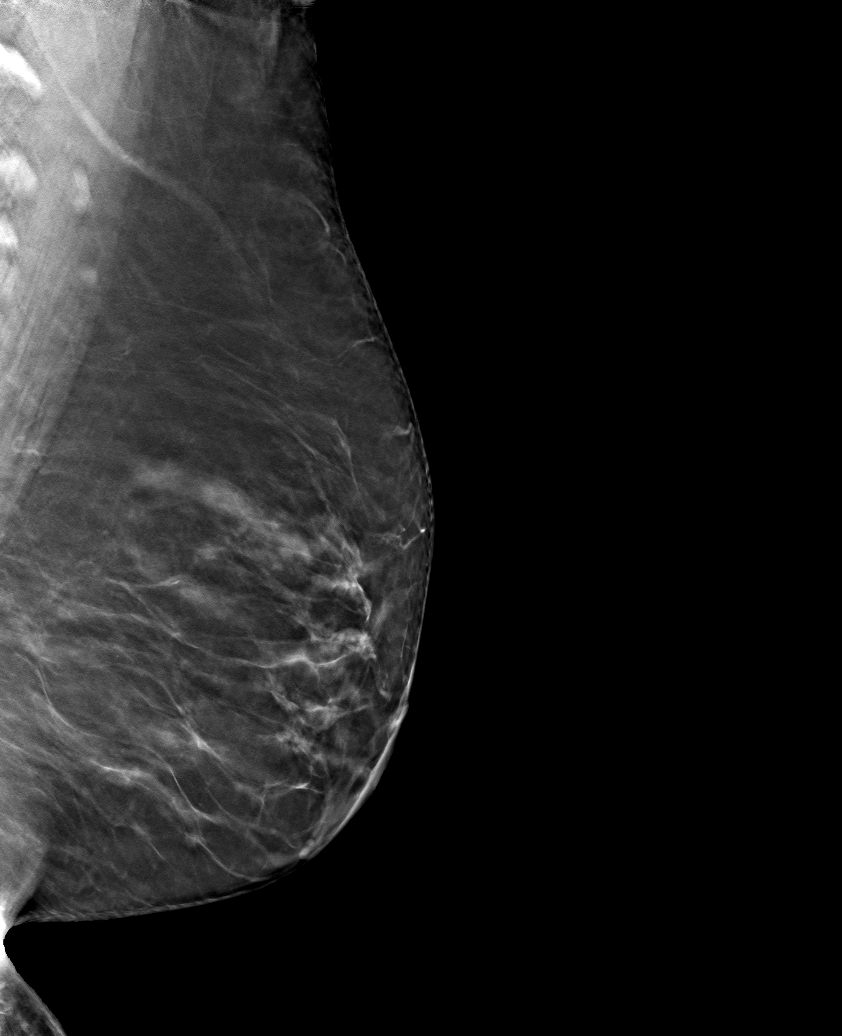

[6 of 30 positions shown; findings below may reference images not displayed]

ACR Breast Density Category b: There are scattered areas of
fibroglandular density.
FINDINGS: There are no findings suspicious for malignancy.
IMPRESSION: No mammographic evidence of malignancy. A result letter of this
screening mammogram will be mailed directly to the patient.

RECOMMENDATION:
Screening mammogram in one year. (Code:9I-Q-JTK)

BI-RADS CATEGORY  1: Negative.

## 2024-01-02 ENCOUNTER — Other Ambulatory Visit: Payer: Self-pay | Admitting: Hematology and Oncology

## 2024-01-02 DIAGNOSIS — Z0000 Encounter for general adult medical examination without abnormal findings: Secondary | ICD-10-CM

## 2024-02-03 ENCOUNTER — Ambulatory Visit
Admission: RE | Admit: 2024-02-03 | Discharge: 2024-02-03 | Disposition: A | Discharge status: Home / Self Care | Source: Ambulatory Visit | Attending: Hematology and Oncology | Admitting: Hematology and Oncology

## 2024-02-03 DIAGNOSIS — Z0000 Encounter for general adult medical examination without abnormal findings: Secondary | ICD-10-CM
